# Patient Record
Sex: Male | Born: 1980 | Hispanic: Yes | Marital: Single | Smoking: Never smoker
Health system: Southern US, Community
[De-identification: ages and names within clinical notes are randomized; demographics above are authoritative.]

## PROBLEM LIST (undated history)

## (undated) DIAGNOSIS — I10 Essential (primary) hypertension: Secondary | ICD-10-CM

## (undated) DIAGNOSIS — E785 Hyperlipidemia, unspecified: Secondary | ICD-10-CM

## (undated) HISTORY — PX: NO PAST SURGERIES: SHX2092

---

## 2007-09-02 ENCOUNTER — Ambulatory Visit: Payer: Self-pay | Admitting: Internal Medicine

## 2007-09-14 ENCOUNTER — Ambulatory Visit: Payer: Self-pay | Admitting: Surgery

## 2013-08-03 ENCOUNTER — Ambulatory Visit: Payer: Self-pay | Admitting: Family Medicine

## 2013-12-21 ENCOUNTER — Emergency Department: Payer: Self-pay | Admitting: Emergency Medicine

## 2013-12-21 LAB — COMPREHENSIVE METABOLIC PANEL
ALK PHOS: 98 U/L
ANION GAP: 8 (ref 7–16)
Albumin: 4.2 g/dL (ref 3.4–5.0)
BILIRUBIN TOTAL: 0.6 mg/dL (ref 0.2–1.0)
BUN: 7 mg/dL (ref 7–18)
CHLORIDE: 105 mmol/L (ref 98–107)
CO2: 27 mmol/L (ref 21–32)
CREATININE: 0.97 mg/dL (ref 0.60–1.30)
Calcium, Total: 8.9 mg/dL (ref 8.5–10.1)
Glucose: 96 mg/dL (ref 65–99)
OSMOLALITY: 277 (ref 275–301)
Potassium: 4 mmol/L (ref 3.5–5.1)
SGOT(AST): 20 U/L (ref 15–37)
SGPT (ALT): 36 U/L
Sodium: 140 mmol/L (ref 136–145)
Total Protein: 8.3 g/dL — ABNORMAL HIGH (ref 6.4–8.2)

## 2013-12-21 LAB — CBC
HCT: 51.9 % (ref 40.0–52.0)
HGB: 17.2 g/dL (ref 13.0–18.0)
MCH: 31.2 pg (ref 26.0–34.0)
MCHC: 33.1 g/dL (ref 32.0–36.0)
MCV: 94 fL (ref 80–100)
PLATELETS: 322 10*3/uL (ref 150–440)
RBC: 5.51 10*6/uL (ref 4.40–5.90)
RDW: 12.9 % (ref 11.5–14.5)
WBC: 7.7 10*3/uL (ref 3.8–10.6)

## 2013-12-21 LAB — TROPONIN I: Troponin-I: <0.02 ng/mL

## 2014-08-25 ENCOUNTER — Other Ambulatory Visit: Payer: Self-pay | Admitting: Gastroenterology

## 2014-08-25 DIAGNOSIS — R1084 Generalized abdominal pain: Secondary | ICD-10-CM

## 2014-08-25 DIAGNOSIS — K6289 Other specified diseases of anus and rectum: Secondary | ICD-10-CM

## 2014-08-29 ENCOUNTER — Ambulatory Visit: Payer: BLUE CROSS/BLUE SHIELD

## 2014-09-01 ENCOUNTER — Ambulatory Visit: Payer: BLUE CROSS/BLUE SHIELD

## 2015-08-18 ENCOUNTER — Other Ambulatory Visit: Payer: Self-pay | Admitting: Student

## 2015-08-18 DIAGNOSIS — R1310 Dysphagia, unspecified: Secondary | ICD-10-CM

## 2015-08-23 ENCOUNTER — Ambulatory Visit: Admission: RE | Admit: 2015-08-23 | Payer: BLUE CROSS/BLUE SHIELD | Source: Ambulatory Visit

## 2015-08-31 ENCOUNTER — Ambulatory Visit
Admission: RE | Admit: 2015-08-31 | Discharge: 2015-08-31 | Disposition: A | Discharge status: Home / Self Care | Payer: 59 | Source: Ambulatory Visit | Attending: Student | Admitting: Student

## 2015-08-31 DIAGNOSIS — R1310 Dysphagia, unspecified: Secondary | ICD-10-CM | POA: Diagnosis present

## 2015-08-31 DIAGNOSIS — K219 Gastro-esophageal reflux disease without esophagitis: Secondary | ICD-10-CM | POA: Diagnosis not present

## 2015-09-27 IMAGING — CR DG CHEST 1V PORT
1 series · 1 of 1 positions shown · non-contrast
Comparison: None.

CLINICAL DATA: Shortness of breath.

EXAM:
PORTABLE CHEST - 1 VIEW

[ap]
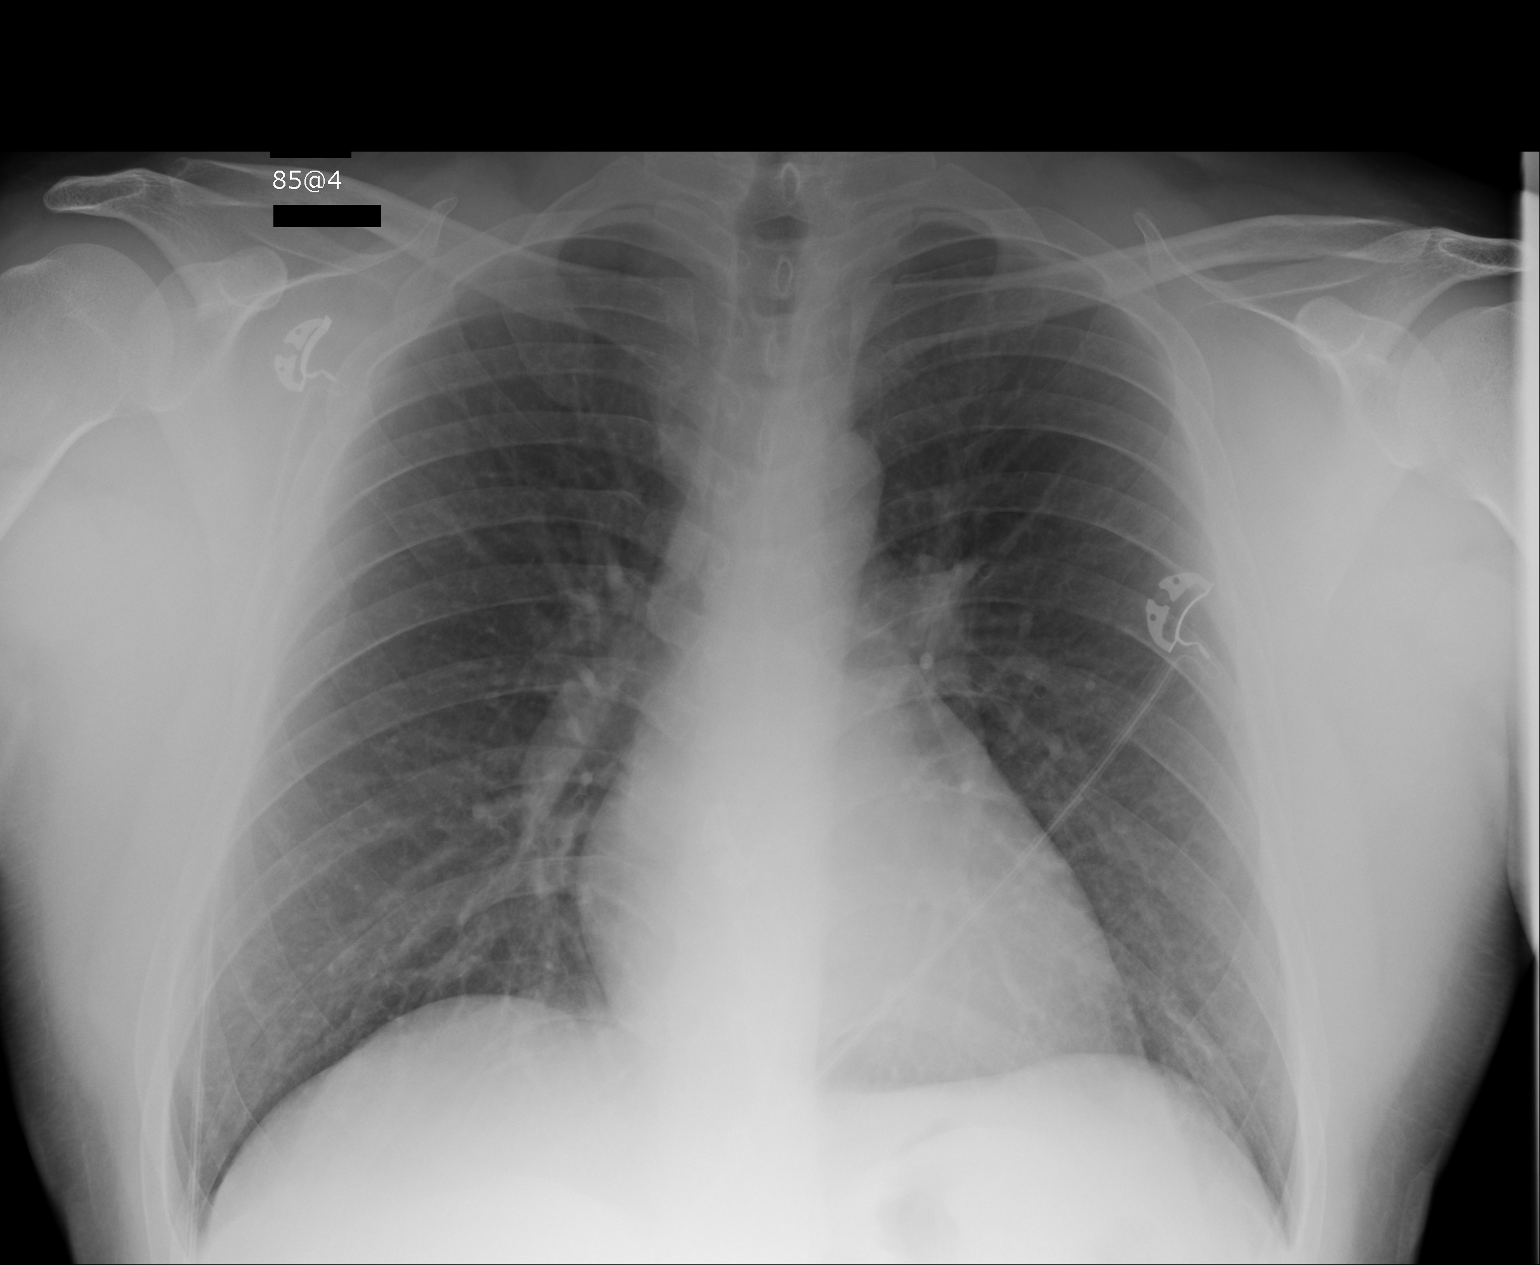

[1 of 1 positions shown; findings below may reference images not displayed]

FINDINGS: The heart size and mediastinal contours are within normal limits.
Both lungs are clear. No pneumothorax or pleural effusion is noted.
The visualized skeletal structures are unremarkable.
IMPRESSION: No acute cardiopulmonary abnormality seen.

## 2017-06-06 IMAGING — RF DG ESOPHAGUS
7 series · 7 of 7 positions shown · non-contrast
Comparison: None in PACs

CLINICAL DATA: Sensation of foods and liquids being stuck in the
throat, left-sided abdominal pain, history of gastroesophageal
reflux not on medication.

EXAM:
ESOPHOGRAM / BARIUM SWALLOW / BARIUM TABLET STUDY
TECHNIQUE: Combined double contrast and single contrast examination performed
using effervescent crystals, thick barium liquid, and thin barium
liquid. The patient was observed with fluoroscopy swallowing a 13 mm
barium sulphate tablet.
FLUOROSCOPY TIME:  Fluoroscopy Time:  0 minutes, 42 seconds
Number of Acquired Images:  7

[Series 1: fluoro_barium 2fps_bw · 0.17mm/px · 1 of 1 slices shown (1 of 7)]
[im 1/1]
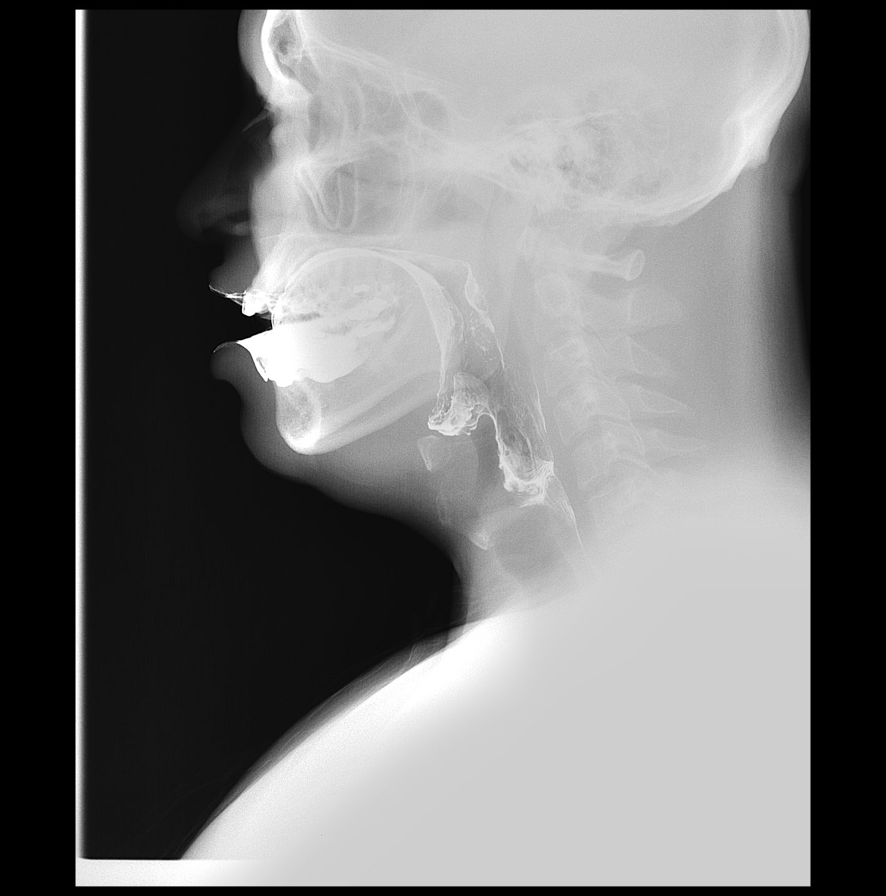

[Series 2: fluoro_barium 2fps_bw · 0.17mm/px · 1 of 1 slices shown (2 of 7)]
[im 1/1]
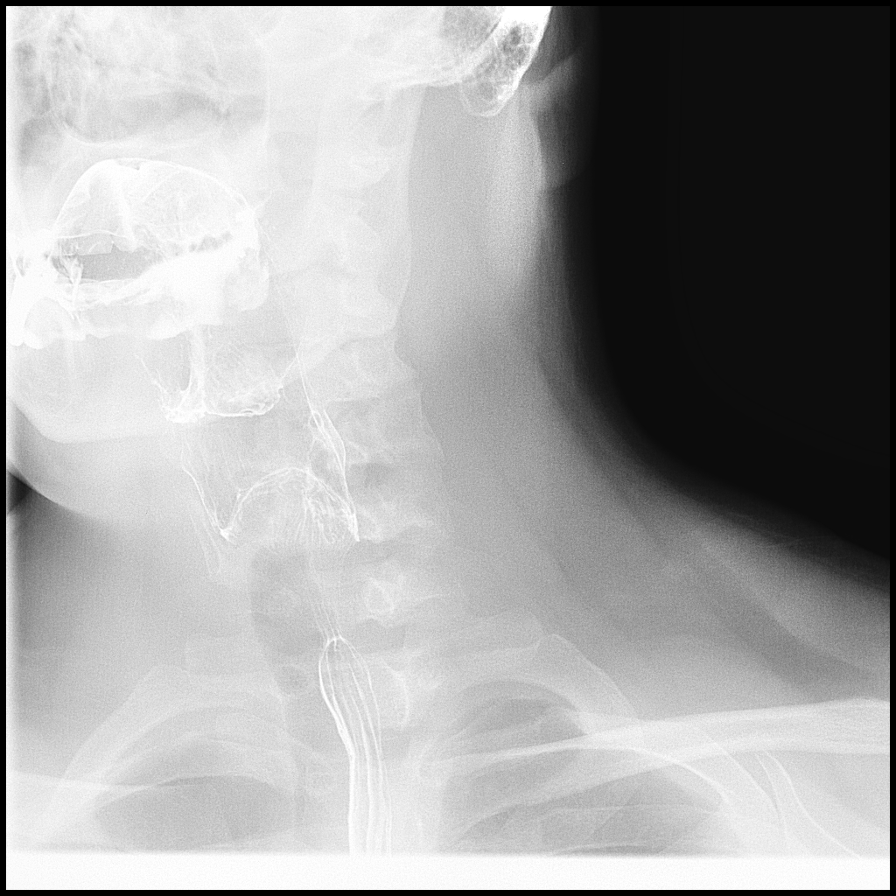

[Series 3: fluoro_barium 2fps_bw · 0.17mm/px · 1 of 1 slices shown (3 of 7)]
[im 1/1]
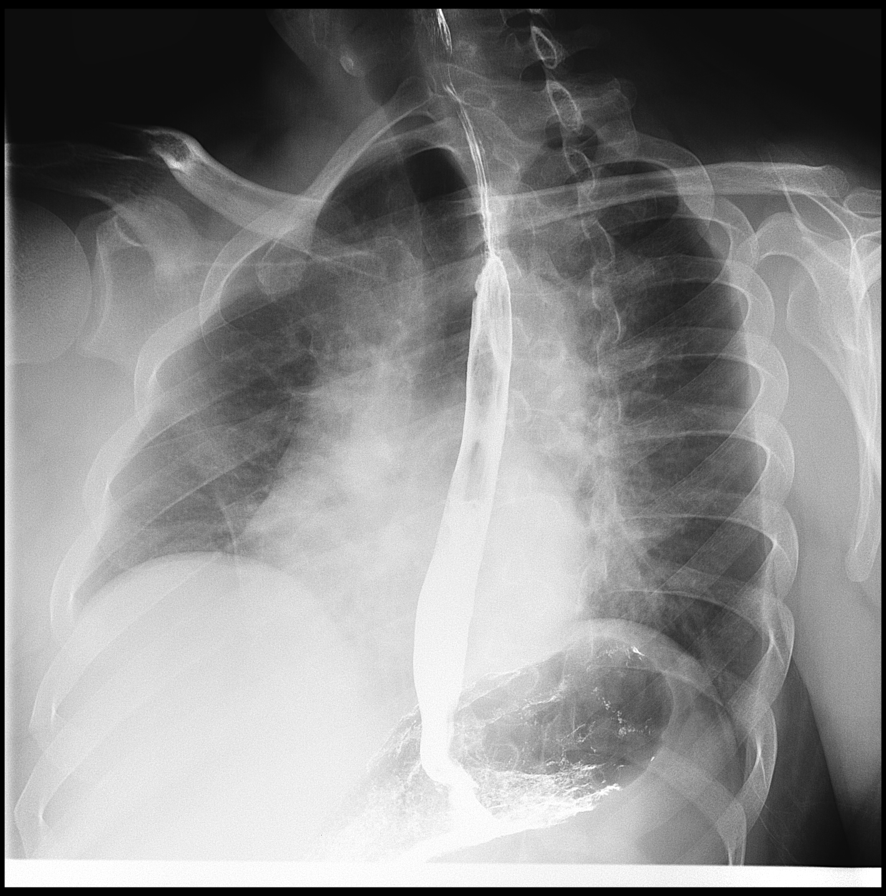

[Series 4: fluoro_barium 2fps_bw · 0.17mm/px · 1 of 1 slices shown (4 of 7)]
[im 1/1]
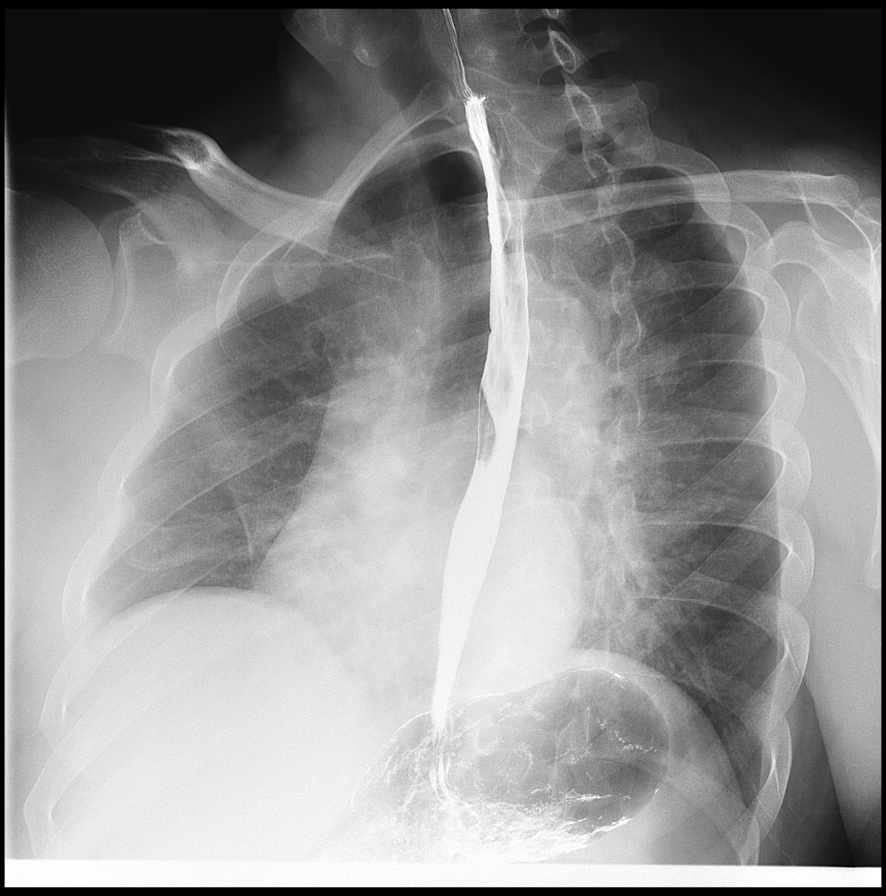

[Series 5: fluoro_barium 2fps_bw · 0.18mm/px · 1 of 1 slices shown (5 of 7)]
[im 1/1]
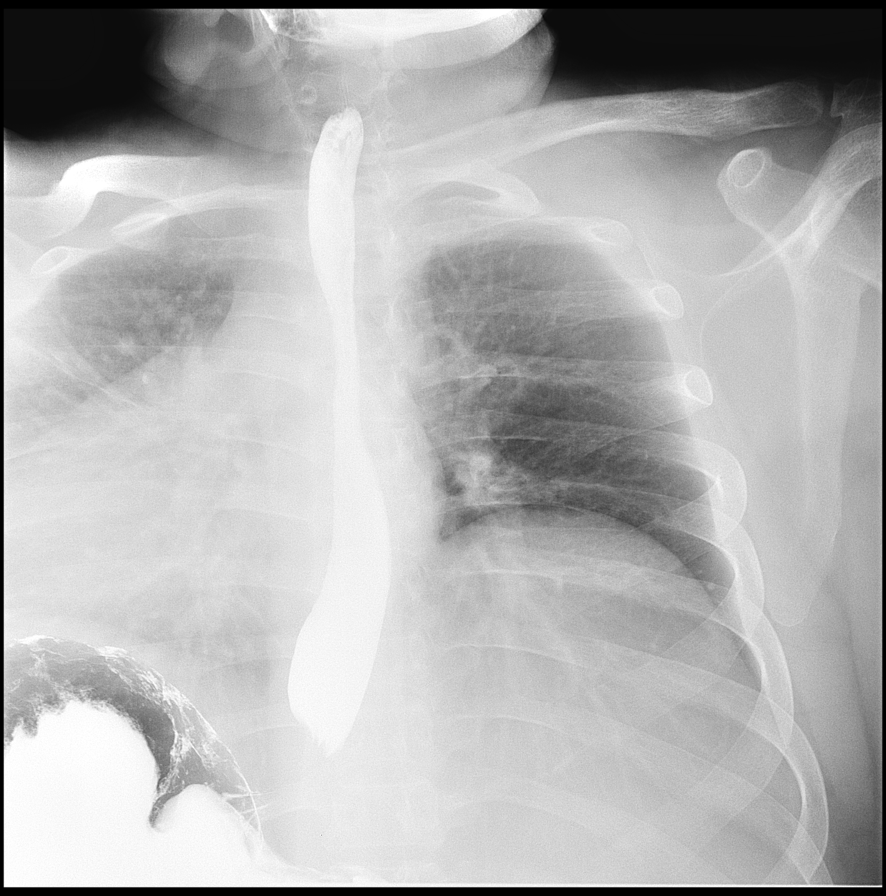

[Series 6: fluoro_barium 2fps_bw · 0.18mm/px · 1 of 1 slices shown (6 of 7)]
[im 1/1]
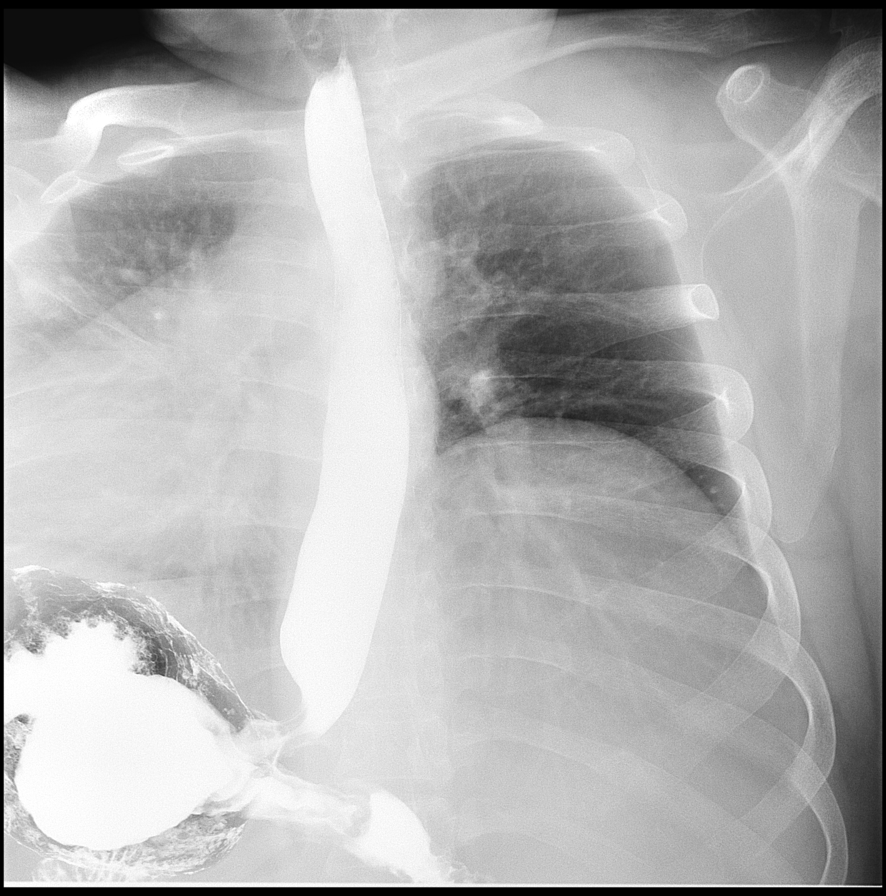

[Series 7: fluoro_barium 2fps_bw · 0.18mm/px · 1 of 1 slices shown (7 of 7)]
[im 1/1]
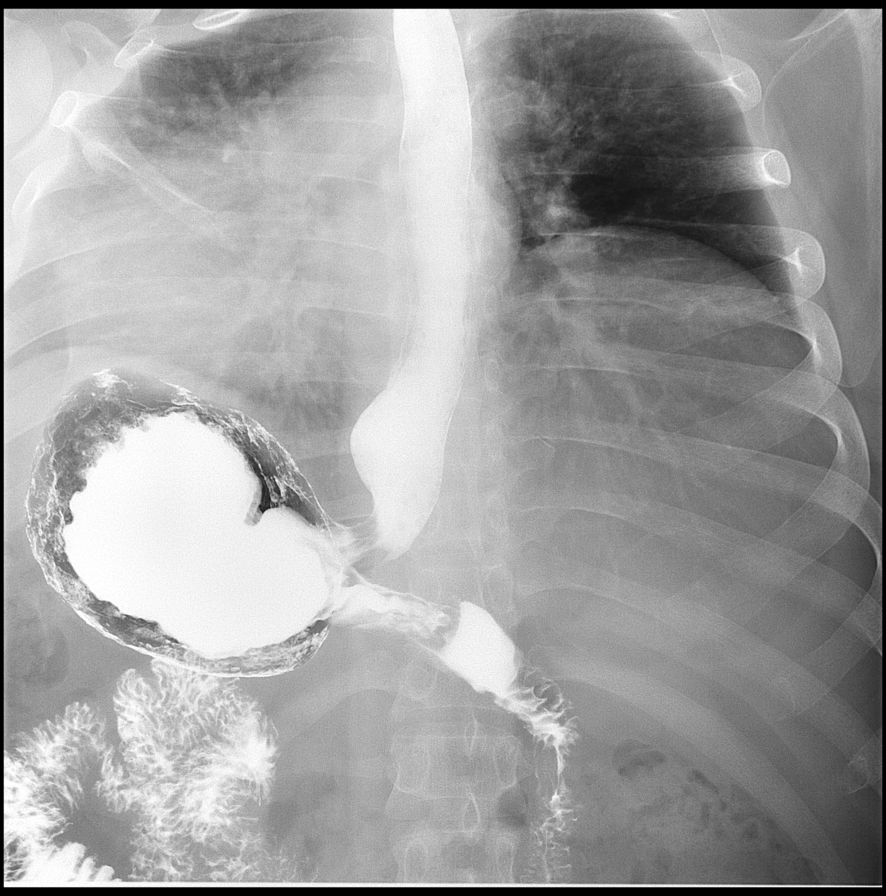

[7 of 7 positions shown; findings below may reference images not displayed]

FINDINGS: An interpreter was present throughout the procedure.

The cervical esophagus distended well. There was no laryngeal
penetration of the barium. No cricopharyngeus muscle impression was
observed. The thoracic esophagus distended well. There was no
evidence of stricture nor esophagitis. A large amount of
gastroesophageal reflux extending to the thoracic inlet was
observed. There was no hiatal hernia. The barium tablet passed
promptly from the mouth to the stomach. Survey views of the stomach
were normal.
IMPRESSION: Moderate amount of gastroesophageal reflux without evidence of
stricture nor esophagitis. No hiatal hernia.

## 2018-08-18 ENCOUNTER — Other Ambulatory Visit: Payer: BLUE CROSS/BLUE SHIELD

## 2018-08-18 ENCOUNTER — Telehealth: Payer: Self-pay

## 2018-08-18 DIAGNOSIS — Z20822 Contact with and (suspected) exposure to covid-19: Secondary | ICD-10-CM

## 2018-08-18 NOTE — Telephone Encounter (Signed)
Received call from patient with c/o of difficulty breathing, headache, cough and sore throat. Patient advised to stay home and I would send his information to the [facility] for the provider to assess if testing is needed.

## 2018-08-18 NOTE — Telephone Encounter (Signed)
Spoke with patient, scheduled him for testing today at [facility] at 3 pm.  Testing protocol reviewed with patient.

## 2018-08-20 ENCOUNTER — Telehealth: Payer: Self-pay

## 2018-08-20 NOTE — Telephone Encounter (Signed)
Called to follow up with patient and symptoms. Patient continues to have cough, sore throat at this time. No fever being noted.

## 2018-08-25 LAB — NOVEL CORONAVIRUS, NAA: SARS-CoV-2, NAA: NOT DETECTED

## 2018-09-02 ENCOUNTER — Other Ambulatory Visit: Payer: Self-pay

## 2018-09-02 ENCOUNTER — Telehealth: Payer: Self-pay

## 2018-09-02 DIAGNOSIS — Z20822 Contact with and (suspected) exposure to covid-19: Secondary | ICD-10-CM

## 2018-09-02 NOTE — Telephone Encounter (Signed)
Received call from patient stating that his symptoms have worsened. Patient now c/o new loss of taste and smell, muscle aches/pains, cough. Patient previously tested negative for COVID-19 but has been in isolation due to daughter testing positive. Patient is very adamant about being retested at this time.

## 2018-09-02 NOTE — Telephone Encounter (Signed)
Spoke with pt with interpreter and pt is aware that we no longer require an appointment. Since this is a repeat test, pt is already aware of the location of the testing site and testing protocols. Pt understood and had no additional questions at this time. He states he will go to testing site today at the [place] site  Order was placed.

## 2018-09-06 LAB — NOVEL CORONAVIRUS, NAA: SARS-CoV-2, NAA: NOT DETECTED

## 2018-12-08 ENCOUNTER — Ambulatory Visit: Payer: Self-pay

## 2018-12-08 ENCOUNTER — Other Ambulatory Visit: Payer: Self-pay

## 2018-12-08 VITALS — BP 140/88 | HR 92 | Resp 16 | Ht 65.0 in | Wt 217.0 lb

## 2018-12-08 DIAGNOSIS — Z008 Encounter for other general examination: Secondary | ICD-10-CM

## 2018-12-08 LAB — POCT LIPID PANEL
HDL: 27
LDL: 37
Non-HDL: 94
POC Glucose: 121 mg/dl — AB (ref 70–99)
TC/HDL: 4.5
TC: 121
TRG: 285

## 2018-12-08 NOTE — Progress Notes (Signed)
     Patient ID: Billy Haney, male    DOB: 1980/10/28, 38 y.o.   MRN: [id number]    Thank you!!  Apolonio Schneiders RN  [place] [facility] [place]: [phone]  Cell:  [phone] Website: [email or web address]

## 2019-11-08 ENCOUNTER — Ambulatory Visit (INDEPENDENT_AMBULATORY_CARE_PROVIDER_SITE_OTHER): Payer: Self-pay

## 2019-11-08 ENCOUNTER — Ambulatory Visit
Admission: EM | Admit: 2019-11-08 | Discharge: 2019-11-08 | Disposition: A | Discharge status: Home / Self Care | Payer: Self-pay | Attending: Emergency Medicine | Admitting: Emergency Medicine

## 2019-11-08 ENCOUNTER — Other Ambulatory Visit: Payer: Self-pay

## 2019-11-08 DIAGNOSIS — S39012A Strain of muscle, fascia and tendon of lower back, initial encounter: Secondary | ICD-10-CM

## 2019-11-08 DIAGNOSIS — M545 Low back pain: Secondary | ICD-10-CM

## 2019-11-08 HISTORY — DX: Hyperlipidemia, unspecified: E78.5

## 2019-11-08 HISTORY — DX: Essential (primary) hypertension: I10

## 2019-11-08 MED ORDER — IBUPROFEN 600 MG PO TABS
600.0000 mg | ORAL_TABLET | Freq: Once | ORAL | Status: AC
  Administered 2019-11-08: 600 mg via ORAL

## 2019-11-08 MED ORDER — IBUPROFEN 600 MG PO TABS: 600.0000 mg | ORAL_TABLET | Freq: Four times a day (QID) | ORAL | 0 refills | Status: AC | PRN

## 2019-11-08 MED ORDER — METHOCARBAMOL 500 MG PO TABS: 1000.0000 mg | ORAL_TABLET | Freq: Four times a day (QID) | ORAL | 0 refills | Status: AC | PRN

## 2019-11-08 NOTE — ED Triage Notes (Signed)
Patient reports he injured his back Wednesday. States when he woke up Thursday, he was unable to get out of bed and reports he actually fell. Reports he spent the weekend resting his back; states he was able to start walking again on Saturday.

## 2019-11-08 NOTE — ED Provider Notes (Signed)
[facility]    CSN: [id number] Arrival date & time: 11/08/19  1701      History   Chief Complaint Chief Complaint  Patient presents with   Back Injury    HPI Billy Haney is a 39 y.o. male.   39 yo male who is here for evaluation of low back pain x 5 after lifting a sheet metal box, striking the door frame and being forced back onto his bottom.   He reports that the following day he fell while trying to get out of bed and felt like his legs were weak. Denies numbness, tingling, or continued weakness. He took a pill that he thinks was a muscle relaxer and his girlfriend massaged his back which helped. After two days of bed rest he was able to walk again.      Past Medical History:  Diagnosis Date   Hyperlipidemia    Hypertension     There are no problems to display for this patient.   Past Surgical History:  Procedure Laterality Date   NO PAST SURGERIES         Home Medications    Prior to Admission medications   Medication Sig Start Date End Date Taking? Authorizing Provider  ibuprofen (ADVIL) 600 MG tablet Take 1 tablet (600 mg total) by mouth every 6 (six) hours as needed. 11/08/19   Becky Augusta, NP  methocarbamol (ROBAXIN) 500 MG tablet Take 2 tablets (1,000 mg total) by mouth every 6 (six) hours as needed for muscle spasms. 11/08/19   Becky Augusta, NP    Family History History reviewed. No pertinent family history.  Social History Social History   Tobacco Use   Smoking status: Never Smoker   Smokeless tobacco: Never Used  Substance Use Topics   Alcohol use: Not Currently   Drug use: Not on file     Allergies   Patient has no known allergies.   Review of Systems Review of Systems  Constitutional: Negative for activity change, appetite change and fever.  HENT: Negative for congestion and rhinorrhea.   Respiratory: Negative for shortness of breath and wheezing.   Cardiovascular: Negative for chest pain.    Musculoskeletal: Positive for arthralgias, back pain and myalgias.  Skin: Negative.   Neurological: Positive for weakness. Negative for numbness.  Hematological: Negative.   Psychiatric/Behavioral: Negative.      Physical Exam Triage Vital Signs ED Triage Vitals  Enc Vitals Group     BP 11/08/19 1732 131/85     Pulse Rate 11/08/19 1732 72     Resp 11/08/19 1732 15     Temp 11/08/19 1732 98.5 F (36.9 C)     Temp src --      SpO2 11/08/19 1732 100 %     Weight --      Height --      Head Circumference --      Peak Flow --      Pain Score 11/08/19 1727 6     Pain Loc --      Pain Edu? --      Excl. in [place]? --    No data found.  Updated Vital Signs BP 131/85    Pulse 72    Temp 98.5 F (36.9 C)    Resp 15    SpO2 100%   Visual Acuity Right Eye Distance:   Left Eye Distance:   Bilateral Distance:    Right Eye Near:   Left Eye Near:    Bilateral  Near:     Physical Exam Vitals and nursing note reviewed.  Constitutional:      General: He is not in acute distress.    Appearance: Normal appearance.  HENT:     Head: Normocephalic and atraumatic.     Nose: Nose normal.  Eyes:     Extraocular Movements: Extraocular movements intact.     Conjunctiva/sclera: Conjunctivae normal.     Pupils: Pupils are equal, round, and reactive to light.  Cardiovascular:     Rate and Rhythm: Normal rate and regular rhythm.     Pulses: Normal pulses.     Heart sounds: Normal heart sounds.  Pulmonary:     Effort: Pulmonary effort is normal.     Breath sounds: Normal breath sounds.  Musculoskeletal:     Cervical back: Normal, normal range of motion and neck supple.     Thoracic back: Normal.     Lumbar back: Spasms, tenderness and bony tenderness present.     Comments: There is moderate spasm in the muscles on the right and left of the lumbar spine. Spinous processes of L3 & L4 are also tender to palpation. No crepitus or ecchymosis noted.   Skin:    General: Skin is warm and dry.      Capillary Refill: Capillary refill takes less than 2 seconds.  Neurological:     General: No focal deficit present.     Mental Status: He is alert and oriented to person, place, and time.     Sensory: Sensation is intact.     Motor: Motor function is intact.     Coordination: Coordination is intact.     Gait: Gait is intact.     Deep Tendon Reflexes: Reflexes are normal and symmetric.  Psychiatric:        Mood and Affect: Mood normal.        Behavior: Behavior normal.        Thought Content: Thought content normal.        Judgment: Judgment normal.      UC Treatments / Results  Labs (all labs ordered are listed, but only abnormal results are displayed) Labs Reviewed - No data to display  EKG   Radiology DG Lumbar Spine Complete  Result Date: 11/08/2019 CLINICAL DATA:  Pain EXAM: LUMBAR SPINE - COMPLETE 4+ VIEW COMPARISON:  None. FINDINGS: There is no evidence of lumbar spine fracture. Alignment is normal. Intervertebral disc spaces are maintained. IMPRESSION: Negative. Electronically Signed   By: Katherine Mantle M.D.   On: 11/08/2019 18:33    Procedures Procedures (including critical care time)  Medications Ordered in UC Medications  ibuprofen (ADVIL) tablet 600 mg (600 mg Oral Given 11/08/19 1746)    Initial Impression / Assessment and Plan / UC Course  I have reviewed the triage vital signs and the nursing notes.  Pertinent labs & imaging results that were available during my care of the patient were reviewed by me and considered in my medical decision making (see chart for details).   Patient is here for evaluation of pain in his low back that started 5 days ago after lifting a sheet metal box striking the overhead door frame that caused him to fall back onto his butt.  He reports that the following day he fell getting out of bed because of the pain and weakness in his legs. After taking what he thinks was a muscle relaxer, bed rest, and massage he felt better  and was able to walk again.  On exam there is spasm present in the muscles of the low back, L>R and tenderness to the L4 & L5 spinous processes. DTR's normal and equal. Strength is 5/5 in both lower extremities.   Will give Ibuprofen and X-ray L-spine.  X-rays are negative for fracture or malalignment.   Will D/C home with Ibuprofen and Methocarbamol. Patient states that his pain improved after receiving a dose here.    Final Clinical Impressions(s) / UC Diagnoses   Final diagnoses:  Lumbar strain, initial encounter     Discharge Instructions     No hay seales de huesos rotos en su radiografa. Tome ibuprofeno 600 mg cada 6 horas en un horario para los prximos [address] y luego segn sea necesario. Tome 2 comprimidos de [place] [employer] cada 6 horas durante los [address] y luego segn sea necesario. Contine usando masaje y calor hmedo. Siga los ejercicios de espalda que le dieron al alta. Puede volver a Development worker, international aid.  There is no sign of broken bones on your x-ray. Take Ibuprofen 600 mg every 6 hours on a schedule for the next two days and then as needed. Take the Methocarbamol, 2 tablets every 6 hours for the next two days and then as needed.  Continue to use massage and moist heat. Follow the back exercises given to you at discharge. You can return to work tomorrow.     ED Prescriptions    Medication Sig Dispense Auth. Provider   ibuprofen (ADVIL) 600 MG tablet Take 1 tablet (600 mg total) by mouth every 6 (six) hours as needed. 30 tablet Becky Augusta, NP   methocarbamol (ROBAXIN) 500 MG tablet Take 2 tablets (1,000 mg total) by mouth every 6 (six) hours as needed for muscle spasms. 20 tablet Becky Augusta, NP     PDMP not reviewed this encounter.   Becky Augusta, NP 11/08/19 1857

## 2019-11-08 NOTE — Discharge Instructions (Signed)
No hay seales de huesos rotos en su radiografa. Tome ibuprofeno 600 mg cada 6 horas en un horario para los prximos [address] y luego segn sea necesario. Tome 2 comprimidos de [place] [employer] cada 6 horas durante los [address] y luego segn sea necesario. Contine usando masaje y calor hmedo. Siga los ejercicios de espalda que le dieron al alta. Puede volver a Development worker, international aid.  There is no sign of broken bones on your x-ray. Take Ibuprofen 600 mg every 6 hours on a schedule for the next two days and then as needed. Take the Methocarbamol, 2 tablets every 6 hours for the next two days and then as needed.  Continue to use massage and moist heat. Follow the back exercises given to you at discharge. You can return to work tomorrow.

## 2022-03-12 ENCOUNTER — Other Ambulatory Visit: Payer: Self-pay

## 2022-03-12 ENCOUNTER — Encounter: Payer: Self-pay | Admitting: Emergency Medicine

## 2022-03-12 ENCOUNTER — Emergency Department: Payer: No Typology Code available for payment source

## 2022-03-12 ENCOUNTER — Emergency Department
Admission: EM | Admit: 2022-03-12 | Discharge: 2022-03-12 | Disposition: A | Discharge status: Home / Self Care | Payer: No Typology Code available for payment source | Attending: Emergency Medicine | Admitting: Emergency Medicine

## 2022-03-12 DIAGNOSIS — Y92410 Unspecified street and highway as the place of occurrence of the external cause: Secondary | ICD-10-CM | POA: Diagnosis not present

## 2022-03-12 DIAGNOSIS — M25562 Pain in left knee: Secondary | ICD-10-CM | POA: Diagnosis not present

## 2022-03-12 DIAGNOSIS — M79609 Pain in unspecified limb: Secondary | ICD-10-CM

## 2022-03-12 DIAGNOSIS — G8911 Acute pain due to trauma: Secondary | ICD-10-CM | POA: Insufficient documentation

## 2022-03-12 DIAGNOSIS — S161XXA Strain of muscle, fascia and tendon at neck level, initial encounter: Secondary | ICD-10-CM | POA: Diagnosis not present

## 2022-03-12 DIAGNOSIS — M546 Pain in thoracic spine: Secondary | ICD-10-CM | POA: Insufficient documentation

## 2022-03-12 DIAGNOSIS — R52 Pain, unspecified: Secondary | ICD-10-CM | POA: Diagnosis present

## 2022-03-12 DIAGNOSIS — M25561 Pain in right knee: Secondary | ICD-10-CM | POA: Diagnosis not present

## 2022-03-12 MED ORDER — NAPROXEN 500 MG PO TABS: 500.0000 mg | ORAL_TABLET | Freq: Two times a day (BID) | ORAL | 0 refills | Status: AC

## 2022-03-12 MED ORDER — TRAMADOL HCL 50 MG PO TABS: 50.0000 mg | ORAL_TABLET | Freq: Four times a day (QID) | ORAL | 0 refills | Status: AC | PRN
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
